# Patient Record
Sex: Male | Born: 1990 | Race: White | Hispanic: No | Marital: Single | Smoking: Current every day smoker
Health system: Southern US, Community
[De-identification: ages and names within clinical notes are randomized; demographics above are authoritative.]

---
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
## 2011-09-08 ENCOUNTER — Ambulatory Visit
Admission: RE | Admit: 2011-09-08 | Discharge: 2011-09-08 | Disposition: A | Discharge status: Home / Self Care | Payer: BC Managed Care – PPO | Source: Ambulatory Visit | Attending: Family Medicine | Admitting: Family Medicine

## 2011-09-08 ENCOUNTER — Other Ambulatory Visit: Payer: Self-pay | Admitting: Family Medicine

## 2011-09-08 DIAGNOSIS — R52 Pain, unspecified: Secondary | ICD-10-CM

## 2013-01-28 ENCOUNTER — Other Ambulatory Visit: Payer: Self-pay | Admitting: Physician Assistant

## 2013-01-28 ENCOUNTER — Ambulatory Visit
Admission: RE | Admit: 2013-01-28 | Discharge: 2013-01-28 | Disposition: A | Discharge status: Home / Self Care | Payer: No Typology Code available for payment source | Source: Ambulatory Visit | Attending: Physician Assistant | Admitting: Physician Assistant

## 2013-01-28 DIAGNOSIS — S6992XA Unspecified injury of left wrist, hand and finger(s), initial encounter: Secondary | ICD-10-CM

## 2013-12-21 ENCOUNTER — Ambulatory Visit (INDEPENDENT_AMBULATORY_CARE_PROVIDER_SITE_OTHER): Payer: BC Managed Care – PPO | Admitting: Family Medicine

## 2013-12-21 VITALS — BP 112/64 | HR 84 | Temp 98.8°F | Resp 12 | Ht 69.0 in | Wt 134.6 lb

## 2013-12-21 DIAGNOSIS — H109 Unspecified conjunctivitis: Secondary | ICD-10-CM

## 2013-12-21 MED ORDER — OFLOXACIN 0.3 % OP SOLN: OPHTHALMIC | Status: AC

## 2013-12-21 NOTE — Patient Instructions (Signed)
Wear dark glasses  Use the eyedrops 1-2 drops in right eye every 3 hours for 2 days, then about 4 times daily for several days  If it is getting worse rather than better return for recheck  Leave contacts out until is doing well. If any suspicion of involvement of left eye please remove that contact.

## 2013-12-21 NOTE — Progress Notes (Signed)
Subjective: Patient has had inflammation developing in his right eye since day before yesterday. Yesterday he was hurting badly and he went to the store and got some soothing drops. It is continued to stay red and irritated and painful, especially in bright light. He's not able to drive. He does wear contacts but has not worn his contacts since yesterday morning. He continues to wear his contact in the left eye but it is not bothering him at all as of now.  Objective: River and hepatic, right eye. He is not able to see well enough to do the eye chart. He is constantly squinting in the right eye. Fundi appear normal. No abrasions or lesions noted of the cornea. Did not do fluorescein staining since there is no history of any foreign body. EOMs intact.  Assessment: Acute right conjunctivitis  Plan: Ofloxacin drops. See instructions.

## 2019-02-28 ENCOUNTER — Other Ambulatory Visit: Payer: Self-pay

## 2019-02-28 DIAGNOSIS — Z20822 Contact with and (suspected) exposure to covid-19: Secondary | ICD-10-CM

## 2019-03-02 LAB — NOVEL CORONAVIRUS, NAA: SARS-CoV-2, NAA: NOT DETECTED

## 2019-06-05 ENCOUNTER — Encounter (HOSPITAL_COMMUNITY): Payer: Self-pay

## 2019-06-05 ENCOUNTER — Ambulatory Visit (INDEPENDENT_AMBULATORY_CARE_PROVIDER_SITE_OTHER): Payer: BC Managed Care – PPO

## 2019-06-05 ENCOUNTER — Ambulatory Visit (HOSPITAL_COMMUNITY)
Admission: EM | Admit: 2019-06-05 | Discharge: 2019-06-05 | Disposition: A | Discharge status: Home / Self Care | Payer: BC Managed Care – PPO | Attending: Internal Medicine | Admitting: Internal Medicine

## 2019-06-05 ENCOUNTER — Other Ambulatory Visit: Payer: Self-pay

## 2019-06-05 DIAGNOSIS — M545 Low back pain, unspecified: Secondary | ICD-10-CM

## 2019-06-05 MED ORDER — IBUPROFEN 800 MG PO TABS: 800.0000 mg | ORAL_TABLET | Freq: Three times a day (TID) | ORAL | 0 refills | Status: AC

## 2019-06-05 MED ORDER — CYCLOBENZAPRINE HCL 5 MG PO TABS: 5.0000 mg | ORAL_TABLET | Freq: Two times a day (BID) | ORAL | 0 refills | Status: AC | PRN

## 2019-06-05 NOTE — Discharge Instructions (Signed)
No fracture on Xray Use anti-inflammatories for pain/swelling. You may take up to 800 mg Ibuprofen every 8 hours with food. You may supplement Ibuprofen with Tylenol [phone] mg every 8 hours.   You may use flexeril as needed to help with pain. This is a muscle relaxer and causes sedation- please use only at bedtime or when you will be home and not have to drive/work  Gentle stretching
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
